# Patient Record
Sex: Male | Born: 1955 | Race: White | Hispanic: No | Marital: Married | Smoking: Never smoker
Health system: Southern US, Community
[De-identification: ages and names within clinical notes are randomized; demographics above are authoritative.]

## PROBLEM LIST (undated history)

## (undated) DIAGNOSIS — G8929 Other chronic pain: Secondary | ICD-10-CM

## (undated) DIAGNOSIS — E119 Type 2 diabetes mellitus without complications: Secondary | ICD-10-CM

## (undated) DIAGNOSIS — M25512 Pain in left shoulder: Secondary | ICD-10-CM

## (undated) DIAGNOSIS — M109 Gout, unspecified: Secondary | ICD-10-CM

## (undated) HISTORY — DX: Other chronic pain: G89.29

## (undated) HISTORY — DX: Type 2 diabetes mellitus without complications: E11.9

## (undated) HISTORY — DX: Pain in left shoulder: M25.512

## (undated) HISTORY — DX: Gout, unspecified: M10.9

---

## 2000-12-02 ENCOUNTER — Ambulatory Visit (HOSPITAL_COMMUNITY): Admission: RE | Admit: 2000-12-02 | Discharge: 2000-12-02 | Payer: Self-pay | Admitting: *Deleted

## 2014-03-01 DIAGNOSIS — E119 Type 2 diabetes mellitus without complications: Secondary | ICD-10-CM

## 2014-03-01 HISTORY — DX: Type 2 diabetes mellitus without complications: E11.9

## 2015-11-13 DIAGNOSIS — G8929 Other chronic pain: Secondary | ICD-10-CM

## 2015-11-13 DIAGNOSIS — M25512 Pain in left shoulder: Secondary | ICD-10-CM

## 2015-11-13 HISTORY — DX: Other chronic pain: G89.29

## 2017-03-07 DIAGNOSIS — M109 Gout, unspecified: Secondary | ICD-10-CM

## 2017-03-07 HISTORY — DX: Gout, unspecified: M10.9

## 2017-10-31 ENCOUNTER — Encounter: Payer: Self-pay | Admitting: Urology

## 2017-10-31 ENCOUNTER — Ambulatory Visit: Payer: Self-pay | Admitting: Urology

## 2017-10-31 VITALS — BP 147/94 | HR 75 | Ht 69.0 in | Wt 225.0 lb

## 2017-10-31 DIAGNOSIS — R350 Frequency of micturition: Secondary | ICD-10-CM

## 2017-10-31 LAB — URINALYSIS, COMPLETE
Bilirubin, UA: NEGATIVE
GLUCOSE, UA: NEGATIVE
KETONES UA: NEGATIVE
Leukocytes, UA: NEGATIVE
NITRITE UA: NEGATIVE
Protein, UA: NEGATIVE
RBC, UA: NEGATIVE
UUROB: 0.2 mg/dL (ref 0.2–1.0)
pH, UA: 6 (ref 5.0–7.5)

## 2017-10-31 LAB — MICROSCOPIC EXAMINATION
Bacteria, UA: NONE SEEN
Epithelial Cells (non renal): NONE SEEN /hpf (ref 0–10)
RBC MICROSCOPIC, UA: NONE SEEN /HPF (ref 0–?)

## 2017-10-31 LAB — BLADDER SCAN AMB NON-IMAGING

## 2017-10-31 NOTE — Progress Notes (Signed)
10/31/2017 10:24 AM   Christian Figueroa [birth year] [id number]  Referring provider: Kandyce Rud, MD [phone] [address] [facility] [place], [place] [address]  Chief Complaint  Patient presents with  . Benign Prostatic Hypertrophy    New Patient    HPI: The patient was referred for urinary frequency and a poor flow for many months.  He voids every 1 hour but can hold it for 2 hours.  His flow is poor.  He hesitates.  Sometimes he strains.  He does not feel empty.  He gets up twice at night  The patient's symptoms did not adequately respond to Flomax or Cialis  He is continent.  He is a non-insulin dependent diabetic  He denies a history of hematuria urinary tract infections and he has not had previous GU surgery  Modifying factors: There are no other modifying factors  Associated signs and symptoms: There are no other associated signs and symptoms Aggravating and relieving factors: There are no other aggravating or relieving factors Severity: Moderate Duration: Persistent   PMH: Past Medical History:  Diagnosis Date  . Chronic left shoulder pain 11/13/2015  . 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 Type II or unspecified type diabetes mellitus without mention of complication, not stated as uncontrolled 03/01/2014    Surgical History:   Home Medications:  Allergies as of 10/31/2017   No Known Allergies     Medication List        Accurate as of 10/31/17 10:24 AM. Always use your most recent med list.          glipiZIDE 10 MG 24 hr tablet Commonly known as:  GLUCOTROL XL Take by mouth.   metFORMIN 500 MG 24 hr tablet Commonly known as:  GLUCOPHAGE-XR Take by mouth.       Allergies: No Known Allergies  Family History: No family history on file.  Social History:  reports that  has never smoked. he has never used smokeless tobacco. He reports that he does not drink alcohol or use drugs.  ROS: UROLOGY Frequent Urination?:  Yes Hard to postpone urination?: Yes Burning/pain with urination?: No Get up at night to urinate?: Yes Leakage of urine?: Yes Urine stream starts and stops?: Yes Trouble starting stream?: Yes Do you have to strain to urinate?: No Blood in urine?: No Urinary tract infection?: No Sexually transmitted disease?: No Injury to kidneys or bladder?: No Painful intercourse?: No Weak stream?: Yes Erection problems?: Yes Penile pain?: No  Gastrointestinal Nausea?: No Vomiting?: No Indigestion/heartburn?: No Diarrhea?: No Constipation?: No  Constitutional Fever: No Night sweats?: No Weight loss?: No Fatigue?: No  Skin Skin rash/lesions?: No Itching?: No  Eyes Blurred vision?: No Double vision?: No  Ears/Nose/Throat Sore throat?: No Sinus problems?: No  Hematologic/Lymphatic Swollen glands?: No Easy bruising?: No  Cardiovascular Leg swelling?: No Chest pain?: No  Respiratory Cough?: No Shortness of breath?: No  Endocrine Excessive thirst?: No  Musculoskeletal Back pain?: No Joint pain?: No  Neurological Headaches?: No Dizziness?: No  Psychologic Depression?: No Anxiety?: No  Physical Exam: BP (!) 147/94   Pulse 75   Ht 5\' 9"  (1.753 m)   Wt 225 lb (102.1 kg)   BMI 33.23 kg/m   Constitutional:  Alert and oriented, No acute distress. HEENT:  AT, moist mucus membranes.  Trachea midline, no masses. Cardiovascular: No clubbing, cyanosis, or edema. Respiratory: Normal respiratory effort, no increased work of breathing. GI: Abdomen is soft, nontender, nondistended, no abdominal masses  GU: Male genitalia normal with a high riding scrotum.  50 g benign prostate Skin: No rashes, bruises or suspicious lesions. Lymph: No cervical or inguinal adenopathy. Neurologic: Grossly intact, no focal deficits, moving all 4 extremities. Psychiatric: Normal mood and affect.  Laboratory Data: No results found for: WBC, HGB, HCT, MCV, PLT  No results found for:  CREATININE  No results found for: PSA  No results found for: TESTOSTERONE  No results found for: HGBA1C  Urinalysis No results found for: COLORURINE, APPEARANCEUR, LABSPEC, PHURINE, GLUCOSEU, HGBUR, BILIRUBINUR, KETONESUR, PROTEINUR, UROBILINOGEN, NITRITE, LEUKOCYTESUR  Pertinent Imaging: None  Assessment & Plan: The patient has significant flow symptoms.  He has hourly frequency and mild nocturia.  After double voiding his residual was 126 mL and it originally was 213 mL.  Pathophysiology discussed.  By history his PSA was normal by his primary care physician.  We talked about the role of cystoscopy.  His cystoscopy does not demonstrate an obvious stricture he agreed that I would order urodynamics.  We will proceed accordingly.  If his residual remains elevated a screening renal ultrasound would be prudent to consider  1. Urinary frequency 2.  Weak urinary flow   - Urinalysis, Complete - BLADDER SCAN AMB NON-IMAGING   Return for cysto.  Martina SinnerMACDIARMID,Adisa Litt A, MD  [facility] [address], [address] [place], [place] [address] [phone]

## 2017-11-14 ENCOUNTER — Other Ambulatory Visit: Payer: Self-pay

## 2017-11-28 ENCOUNTER — Encounter: Payer: Self-pay | Admitting: Urology

## 2017-11-28 ENCOUNTER — Other Ambulatory Visit: Payer: Self-pay

## 2017-11-28 ENCOUNTER — Telehealth: Payer: Self-pay | Admitting: Urology

## 2017-11-28 ENCOUNTER — Emergency Department
Admission: EM | Admit: 2017-11-28 | Discharge: 2017-11-28 | Disposition: A | Discharge status: Home / Self Care | Payer: Self-pay | Attending: Emergency Medicine | Admitting: Emergency Medicine

## 2017-11-28 ENCOUNTER — Encounter: Payer: Self-pay | Admitting: Emergency Medicine

## 2017-11-28 ENCOUNTER — Ambulatory Visit (INDEPENDENT_AMBULATORY_CARE_PROVIDER_SITE_OTHER): Payer: Self-pay | Admitting: Urology

## 2017-11-28 VITALS — BP 171/110 | HR 96 | Resp 16 | Ht 69.0 in | Wt 235.6 lb

## 2017-11-28 DIAGNOSIS — E119 Type 2 diabetes mellitus without complications: Secondary | ICD-10-CM | POA: Insufficient documentation

## 2017-11-28 DIAGNOSIS — R030 Elevated blood-pressure reading, without diagnosis of hypertension: Secondary | ICD-10-CM | POA: Insufficient documentation

## 2017-11-28 DIAGNOSIS — R350 Frequency of micturition: Secondary | ICD-10-CM

## 2017-11-28 DIAGNOSIS — Z7984 Long term (current) use of oral hypoglycemic drugs: Secondary | ICD-10-CM | POA: Insufficient documentation

## 2017-11-28 LAB — CBC WITH DIFFERENTIAL/PLATELET
Basophils Absolute: 0.1 10*3/uL (ref 0–0.1)
Basophils Relative: 1 %
EOS ABS: 0.3 10*3/uL (ref 0–0.7)
EOS PCT: 3 %
HCT: 42.9 % (ref 40.0–52.0)
HEMOGLOBIN: 14.8 g/dL (ref 13.0–18.0)
LYMPHS ABS: 2.4 10*3/uL (ref 1.0–3.6)
LYMPHS PCT: 27 %
MCH: 30.6 pg (ref 26.0–34.0)
MCHC: 34.4 g/dL (ref 32.0–36.0)
MCV: 89 fL (ref 80.0–100.0)
MONOS PCT: 8 %
Monocytes Absolute: 0.7 10*3/uL (ref 0.2–1.0)
Neutro Abs: 5.5 10*3/uL (ref 1.4–6.5)
Neutrophils Relative %: 61 %
PLATELETS: 275 10*3/uL (ref 150–440)
RBC: 4.82 MIL/uL (ref 4.40–5.90)
RDW: 12.9 % (ref 11.5–14.5)
WBC: 9.1 10*3/uL (ref 3.8–10.6)

## 2017-11-28 LAB — BASIC METABOLIC PANEL
Anion gap: 7 (ref 5–15)
BUN: 16 mg/dL (ref 6–20)
CHLORIDE: 105 mmol/L (ref 101–111)
CO2: 25 mmol/L (ref 22–32)
CREATININE: 1.09 mg/dL (ref 0.61–1.24)
Calcium: 8.9 mg/dL (ref 8.9–10.3)
GFR calc Af Amer: >60 mL/min (ref >60)
GFR calc non Af Amer: >60 mL/min (ref >60)
Glucose, Bld: 136 mg/dL — ABNORMAL HIGH (ref 65–99)
POTASSIUM: 3.6 mmol/L (ref 3.5–5.1)
SODIUM: 137 mmol/L (ref 135–145)

## 2017-11-28 LAB — URINALYSIS, COMPLETE
BILIRUBIN UA: NEGATIVE
Ketones, UA: NEGATIVE
LEUKOCYTES UA: NEGATIVE
Nitrite, UA: NEGATIVE
PROTEIN UA: NEGATIVE
RBC, UA: NEGATIVE
Specific Gravity, UA: 1.015 (ref 1.005–1.030)
Urobilinogen, Ur: 0.2 mg/dL (ref 0.2–1.0)
pH, UA: 5.5 (ref 5.0–7.5)

## 2017-11-28 LAB — TROPONIN I: Troponin I: <0.03 ng/mL (ref <0.03)

## 2017-11-28 MED ORDER — LIDOCAINE HCL 2 % EX GEL
1.0000 "application " | Freq: Once | CUTANEOUS | Status: AC
  Administered 2017-11-28: 1 via URETHRAL

## 2017-11-28 MED ORDER — CIPROFLOXACIN HCL 500 MG PO TABS
500.0000 mg | ORAL_TABLET | Freq: Once | ORAL | Status: AC
  Administered 2017-11-28: 500 mg via ORAL

## 2017-11-28 NOTE — ED Triage Notes (Signed)
FIRST NURSE NOTE-here for elevated bps today.  Ambulatory. Unlabored. NAD

## 2017-11-28 NOTE — Progress Notes (Signed)
11/28/2017 9:26 AM   Christian Figueroa 1956/01/01 [id number]  Referring provider: Kandyce Rud, MD [phone] [facility] [name], [name]  Chief Complaint  Patient presents with  . Cysto    HPI: The patient was referred for urinary frequency and Figueroa poor flow for many months.  He voids every 1 hour but can hold it for 2 hours.  His flow is poor.  He hesitates.  Sometimes he strains.  He does not feel empty.  He gets up twice at night  The patient's symptoms did not adequately respond to Flomax or Cialis  He is continent.  He is Figueroa non-insulin dependent diabetic  The patient has significant flow symptoms.  He has hourly frequency and mild nocturia.  After double voiding his residual was 126 mL and it originally was 213 mL.  Pathophysiology discussed.  By history his PSA was normal by his primary care physician.  We talked about the role of cystoscopy.  His cystoscopy does not demonstrate an obvious stricture he agreed that I would order urodynamics.  We will proceed accordingly.  If his residual remains elevated Figueroa screening renal ultrasound would be prudent to consider  Today Flow stable.  Frequency stable.  Clinically not infected After verbal and written consent patient underwent cystoscopy: The penile and bulbar urethra are normal.  There was no stricture.  He had bilobar enlargement of the prostate.  Prostatic urethra was approximately 2.5 cm in length.  Verumontanum was easily identified.  It appeared he had Figueroa high riding bladder neck.  The bladder mucosa was normal.  I did not see any carcinoma or cystitis.  I did has some difficulty seeing the floor of the bladder and trigone in spite of multiple attempts.  He had Figueroa little bit of oozing in the prostatic urethra from friable blood vessels.  He tolerated the procedure well   PMH: Past Medical History:  Diagnosis Date  . Chronic left shoulder pain 11/13/2015  . Gout of right  ankle 03/07/2017  . Type II or unspecified type diabetes mellitus without mention of complication, not stated as uncontrolled 03/01/2014    Surgical History: History reviewed. No pertinent surgical history.  Home Medications:  Allergies as of 11/28/2017   No Known Allergies     Medication List        Accurate as of 11/28/17  9:26 AM. Always use your most recent med list.          glipiZIDE 10 MG 24 hr tablet Commonly known as:  GLUCOTROL XL Take by mouth.   metFORMIN 500 MG 24 hr tablet Commonly known as:  GLUCOPHAGE-XR Take by mouth.       Allergies: No Known Allergies  Family History: History reviewed. No pertinent family history.  Social History:  reports that he has never smoked. He has never used smokeless tobacco. He reports that he does not drink alcohol or use drugs.  ROS:                                        Physical Exam: BP (!) 171/110   Pulse 96   Resp 16   Ht 5\' 9"  (1.753 m)   Wt 235 lb 9.6 oz (106.9 kg)   SpO2 98%   BMI 34.79 kg/m   Constitutional:  Alert and oriented, No acute distress.  Laboratory Data:  No results found for: WBC, HGB, HCT, MCV, PLT  No results found for: CREATININE  No results found for: PSA  No results found for: TESTOSTERONE  No results found for: HGBA1C  Urinalysis    Component Value Date/Time   APPEARANCEUR Clear 10/31/2017 0954   GLUCOSEU Negative 10/31/2017 0954   BILIRUBINUR Negative 10/31/2017 0954   PROTEINUR Negative 10/31/2017 0954   NITRITE Negative 10/31/2017 0954   LEUKOCYTESUR Negative 10/31/2017 0954    Pertinent Imaging: none  Assessment & Plan:  The patient does not have Figueroa stricture.  He does have significant flow symptoms.  The role of urodynamics was described and ordered.  He may need an outlet procedure to reach his treatment goal.  If so reinspection of the trigone would be prudent.  2 days of ciprofloxacin samples given.  He may be an excellent candidate in the  future for procedure such as uro-lift  1. Urinary frequency  - Urinalysis, Complete - ciprofloxacin (CIPRO) tablet 500 mg - lidocaine (XYLOCAINE) 2 % jelly 1 application   No follow-ups on file.  Christian Figueroa,Christian Nicks A, MD  [facility] [address], [address] [place], [place] [address] [phone]

## 2017-11-28 NOTE — Telephone Encounter (Signed)
Patient did not schedule UDS yet waiting to hear back on the price for self pay patient's.  Marcelino DusterMichelle

## 2017-11-28 NOTE — Discharge Instructions (Addendum)
Your exam, EKG, and labs are all normal today. Continue to monitor your blood pressures and follow-up with Dr. Larwance SachsBabaoff for further management. Return to the ED as needed.

## 2017-11-28 NOTE — ED Triage Notes (Signed)
Pt arrived to the ED accompanied by his wife for complaints of high blood pressure. Pt states that he has been experiencing "high blood pressure all day and wants to get checked up." Pt is AOx4 in no apparent distress with no other accompanied symptoms.

## 2017-11-28 NOTE — ED Provider Notes (Signed)
[facility] Emergency Department Provider Note ____________________________________________  Time seen: 2012  I have reviewed the triage vital signs and the nursing notes.  HISTORY  Chief Complaint  Hypertension  HPI Christian Figueroa is a 62 y.o. male presents to the ED accompanied by his wife, for evaluation of elevated blood pressure.  Patient had a scheduled routine cystoscopy earlier today, and prior to the procedure experienced an episode of nausea without vomiting and shaking.  This was after he had his breakfast meal, so his wife checked his blood sugars which were around 200.  Patient did not experience any syncope, chest pain, or shortness of breath.  She also measured his blood pressure which is elevated at around 160/100.  The patient proceeded to have the cystoscopy performed and in the clinic was found to have a blood pressure at 171/100.  Patient presents today for evaluation of elevated blood pressure.  He denies a diagnosis of hypertension and denies any known history of elevated or out of range blood pressures.  He takes metformin for his diabetes but denies any other medications at this time.  He has not taken any over-the-counter decongestants, antihistamines, or anti-inflammatories.  He presents for evaluation of blood pressure.  He denies any chest pain, shortness of breath, paresthesias, weakness, or headache at this time.  Past Medical History:  Diagnosis Date  . Chronic left shoulder pain 11/13/2015  . 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 Type II or unspecified type diabetes mellitus without mention of complication, not stated as uncontrolled 03/01/2014    Patient Active Problem List   Diagnosis Date Noted  . 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 Chronic left shoulder pain 11/13/2015  . Type II or unspecified type diabetes mellitus without mention of complication, not stated as uncontrolled 03/01/2014    History reviewed. No pertinent surgical  history.  Prior to Admission medications   Medication Sig Start Date End Date Taking? Authorizing Provider  glipiZIDE (GLUCOTROL XL) 10 MG 24 hr tablet Take by mouth. 10/17/17 10/17/18  [provider]  metFORMIN (GLUCOPHAGE-XR) 500 MG 24 hr tablet Take 500 mg by mouth 2 (two) times daily.  10/17/17 10/17/18  [provider]    Allergies Patient has no known allergies.  History reviewed. No pertinent family history.  Social History Social History   Tobacco Use  . Smoking status: Never Smoker  . Smokeless tobacco: Never Used  Substance Use Topics  . Alcohol use: No    Frequency: Never  . Drug use: No    Review of Systems  Constitutional: Negative for fever. Eyes: Negative for visual changes. ENT: Negative for sore throat. Cardiovascular: Negative for chest pain.  Elevated blood pressure as above. Respiratory: Negative for shortness of breath. Gastrointestinal: Negative for abdominal pain, vomiting and diarrhea. Genitourinary: Negative for dysuria. Musculoskeletal: Negative for back pain. Skin: Negative for rash. Neurological: Negative for headaches, focal weakness or numbness. ____________________________________________  PHYSICAL EXAM:  VITAL SIGNS: ED Triage Vitals  Enc Vitals Group     BP 11/28/17 1907 (!) 161/99     Pulse Rate 11/28/17 1907 96     Resp 11/28/17 1907 18     Temp 11/28/17 1907 98.6 F (37 C)     Temp Source 11/28/17 1907 Oral     SpO2 11/28/17 1907 96 %     Weight 11/28/17 1908 236 lb (107 kg)     Height 11/28/17 1908 5\' 9"  (1.753 m)     Head Circumference --  Peak Flow --      Pain Score 11/28/17 1908 0     Pain Loc --      Pain Edu? --      Excl. in GC? --     Constitutional: Alert and oriented. Well appearing and in no distress. Head: Normocephalic and atraumatic. Eyes: Conjunctivae are normal. PERRL. Normal extraocular movements Neck: Supple. No thyromegaly. Cardiovascular: Normal rate, regular rhythm. Normal  distal pulses. Respiratory: Normal respiratory effort. No wheezes/rales/rhonchi. Gastrointestinal: Soft and nontender. No distention,, rebound, guarding, or rigidity. Musculoskeletal: Nontender with normal range of motion in all extremities.  Neurologic:  Normal gait without ataxia. Normal speech and language. No gross focal neurologic deficits are appreciated. Skin:  Skin is warm, dry and intact. No rash noted. Psychiatric: Mood and affect are normal. Patient exhibits appropriate insight and judgment. ____________________________________________   LABS (pertinent positives/negatives)  Labs Reviewed  BASIC METABOLIC PANEL - Abnormal; Notable for the following components:      Result Value   Glucose, Bld 136 (*)    All other components within normal limits  CBC WITH DIFFERENTIAL/PLATELET  TROPONIN I  ____________________________________________  EKG  NSR  Normal axis No STEMI ____________________________________________  INITIAL IMPRESSION / ASSESSMENT AND PLAN / ED COURSE  Patient with a ED evaluation of elevated blood pressures.  Patient does not have a existing diagnosis of hypertension.  Review of his chart does reveal several elevated blood pressures well into the category of class II hypertension.  Patient is reassured by his exam, labs, and EKG at this time.  He reports no complaints of pain at the time of this disposition.  He is inclined to follow-up with his primary provider and have discussion about the possibility of him needing to be on a blood pressure medicine.  No hypertensive emergency exist on this presentation.  He is discharged with instructions to continue to monitor blood pressures and follow-up with the primary provider as discussed.  Return precautions have been reviewed. ____________________________________________  FINAL CLINICAL IMPRESSION(S) / ED DIAGNOSES  Final diagnoses:  Elevated BP without diagnosis of hypertension      Anatole Apollo, Charlesetta Ivory,  PA-C 11/28/17 2146    Nita Sickle, MD 11/29/17 [phone]

## 2017-12-03 ENCOUNTER — Other Ambulatory Visit: Payer: Self-pay

## 2017-12-03 DIAGNOSIS — Z5321 Procedure and treatment not carried out due to patient leaving prior to being seen by health care provider: Secondary | ICD-10-CM | POA: Insufficient documentation

## 2017-12-03 DIAGNOSIS — I10 Essential (primary) hypertension: Secondary | ICD-10-CM | POA: Insufficient documentation

## 2017-12-03 LAB — BASIC METABOLIC PANEL
Anion gap: 12 (ref 5–15)
BUN: 21 mg/dL — ABNORMAL HIGH (ref 6–20)
CALCIUM: 9.1 mg/dL (ref 8.9–10.3)
CO2: 23 mmol/L (ref 22–32)
CREATININE: 1.41 mg/dL — AB (ref 0.61–1.24)
Chloride: 101 mmol/L (ref 101–111)
GFR calc Af Amer: >60 mL/min (ref >60)
GFR, EST NON AFRICAN AMERICAN: 53 mL/min — AB (ref >60)
GLUCOSE: 271 mg/dL — AB (ref 65–99)
Potassium: 3.8 mmol/L (ref 3.5–5.1)
Sodium: 136 mmol/L (ref 135–145)

## 2017-12-03 LAB — CBC
HCT: 42.5 % (ref 40.0–52.0)
Hemoglobin: 14.6 g/dL (ref 13.0–18.0)
MCH: 30.6 pg (ref 26.0–34.0)
MCHC: 34.3 g/dL (ref 32.0–36.0)
MCV: 89.3 fL (ref 80.0–100.0)
PLATELETS: 289 10*3/uL (ref 150–440)
RBC: 4.75 MIL/uL (ref 4.40–5.90)
RDW: 12.9 % (ref 11.5–14.5)
WBC: 11.4 10*3/uL — ABNORMAL HIGH (ref 3.8–10.6)

## 2017-12-03 LAB — TROPONIN I

## 2017-12-03 NOTE — ED Triage Notes (Signed)
BP at home was 217/107. C/o lightheaded, HA, dry mouth. Denies hx of HTN except these last few weeks. Denies being started on any HTN medications. Alert, oriented, ambulatory. No distress noted. States seeing PCP this coming Monday.

## 2017-12-03 NOTE — ED Notes (Signed)
First nurse note: pt states he is here for htn. Pt states blood pressure 217/115 at home, pt complains of "lightheadedness", pt appears in no acute distress.

## 2017-12-04 ENCOUNTER — Emergency Department
Admission: EM | Admit: 2017-12-04 | Discharge: 2017-12-04 | Disposition: A | Discharge status: Home / Self Care | Payer: Self-pay | Attending: Emergency Medicine | Admitting: Emergency Medicine

## 2017-12-04 NOTE — ED Notes (Signed)
Pt ambulatory with steady gait to ask for repeat BP. Pt and spouse stating at this time that they were going to leave if pt's BP was improved. Pt leaving at this time in NAD.

## 2017-12-05 NOTE — Telephone Encounter (Signed)
Patient stated that he is not interested in doing UDS at [facility] due to the cost. He is thinking about having it done somewhere else and if he does he will send the results to us.   Marcelino DusterMichelle

## 2018-01-27 ENCOUNTER — Other Ambulatory Visit: Payer: Self-pay | Admitting: Orthopedic Surgery

## 2018-01-27 DIAGNOSIS — M67912 Unspecified disorder of synovium and tendon, left shoulder: Secondary | ICD-10-CM

## 2018-02-07 ENCOUNTER — Ambulatory Visit
Admission: RE | Admit: 2018-02-07 | Discharge: 2018-02-07 | Disposition: A | Discharge status: Home / Self Care | Payer: 59 | Source: Ambulatory Visit | Attending: Orthopedic Surgery | Admitting: Orthopedic Surgery

## 2018-02-07 DIAGNOSIS — M62532 Muscle wasting and atrophy, not elsewhere classified, left forearm: Secondary | ICD-10-CM | POA: Insufficient documentation

## 2018-02-07 DIAGNOSIS — M75122 Complete rotator cuff tear or rupture of left shoulder, not specified as traumatic: Secondary | ICD-10-CM | POA: Diagnosis not present

## 2018-02-07 DIAGNOSIS — M19012 Primary osteoarthritis, left shoulder: Secondary | ICD-10-CM | POA: Insufficient documentation

## 2018-02-07 DIAGNOSIS — M67912 Unspecified disorder of synovium and tendon, left shoulder: Secondary | ICD-10-CM | POA: Insufficient documentation

## 2018-02-16 ENCOUNTER — Other Ambulatory Visit: Payer: Self-pay | Admitting: Urology

## 2018-03-27 ENCOUNTER — Encounter: Payer: Self-pay | Admitting: Urology

## 2018-03-27 ENCOUNTER — Ambulatory Visit (INDEPENDENT_AMBULATORY_CARE_PROVIDER_SITE_OTHER): Payer: 59 | Admitting: Urology

## 2018-03-27 VITALS — BP 155/94 | HR 93 | Ht 69.0 in | Wt 240.3 lb

## 2018-03-27 DIAGNOSIS — R3912 Poor urinary stream: Secondary | ICD-10-CM

## 2018-03-27 NOTE — Progress Notes (Signed)
03/27/2018 11:30 AM   Christian Figueroa [birth year] [id number]  Referring provider: Kandyce Rud, MD [phone] [facility] [place], [place] [address]  Chief Complaint  Patient presents with  . Follow-up    UDS results    HPI: The patient was referred for urinary frequency and a poor flow for many months. He voids every 1 hour but can hold it for 2 hours. His flow is poor. He hesitates. Sometimes he strains. He does not feel empty. He gets up twice at night  The patient's symptoms did not adequately respond to Flomax or Cialis  He is continent. He is a non-insulin dependent diabetic  The patient has significant flow symptoms. He has hourly frequency and mild nocturia. After double voiding his residual was 126 mL and it originally was 213 mL. Pathophysiology discussed. By history his PSA was normal by his primary care physician. We talked about the role of cystoscopy. His cystoscopy does not demonstrate an obvious stricture he agreed that I would order urodynamics. We will proceed accordingly.If his residual remains elevated a screening renal ultrasound would be prudent to consider  After verbal and written consent patient underwent cystoscopy: The penile and bulbar urethra are normal.  There was no stricture.  He had bilobar enlargement of the prostate.  Prostatic urethra was approximately 2.5 cm in length.  Verumontanum was easily identified.  It appeared he had a high riding bladder neck.  The bladder mucosa was normal.  I did not see any carcinoma or cystitis.  I did has some difficulty seeing the floor of the bladder and trigone in spite of multiple attempts.  He had a little bit of oozing in the prostatic urethra from friable blood vessels.  He tolerated the procedure well  Today I felt in the past the patient may be a candidate for a UroLift procedure.   Slow flow stable and frequency stable On urodynamics he had not  voided for 1 hour and was catheterized for 300 mL.  Maximum bladder capacity was 440 mL.  The patient had low pressure bladder instability reaching pressures of 8 cm of water.  He felt the urge but did not leak.  He had no stress incontinence with a Valsalva pressure of 81 cm of water.  During voluntary voiding he voided 338 mL.  Maximum voiding pressure was 83 cm of water.  Maximum flow was 12 mils per second.  Residual was 75 to 100 mL.  EMG activity was normal during the voiding phase.  Bladder was mildly trabeculated.  He had mild elevation of the bladder base         PMH: Past Medical History:  Diagnosis Date  . Chronic left shoulder pain 11/13/2015  . 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 Type II or unspecified type diabetes mellitus without mention of complication, not stated as uncontrolled 03/01/2014    Surgical History: History reviewed. No pertinent surgical history.  Home Medications:  Allergies as of 03/27/2018   No Known Allergies     Medication List        Accurate as of 03/27/18 11:30 AM. Always use your most recent med list.          glipiZIDE 10 MG 24 hr tablet Commonly known as:  GLUCOTROL XL Take by mouth.   metFORMIN 500 MG 24 hr tablet Commonly known as:  GLUCOPHAGE-XR Take 500 mg by mouth 2 (two) times daily.   telmisartan 80 MG tablet Commonly  known as:  MICARDIS       Allergies: No Known Allergies  Family History: History reviewed. No pertinent family history.  Social History:  reports that he has never smoked. He has never used smokeless tobacco. He reports that he does not drink alcohol or use drugs.  ROS:                                        Physical Exam: BP (!) 155/94 (BP Location: Right Arm, Patient Position: Sitting, Cuff Size: Normal)   Pulse 93   Ht 5\' 9"  (1.753 m)   Wt 240 lb 4.8 oz (109 kg)   BMI 35.49 kg/m   Constitutional:  Alert and oriented, No acute distress.  Laboratory Data: Lab Results    Component Value Date   WBC 11.4 (H) 12/03/2017   HGB 14.6 12/03/2017   HCT 42.5 12/03/2017   MCV 89.3 12/03/2017   PLT 289 12/03/2017    Lab Results  Component Value Date   CREATININE 1.41 (H) 12/03/2017    No results found for: PSA  No results found for: TESTOSTERONE  No results found for: HGBA1C  Urinalysis    Component Value Date/Time   APPEARANCEUR Clear 11/28/2017 0849   GLUCOSEU 2+ (A) 11/28/2017 0849   BILIRUBINUR Negative 11/28/2017 0849   PROTEINUR Negative 11/28/2017 0849   NITRITE Negative 11/28/2017 0849   LEUKOCYTESUR Negative 11/28/2017 0849    Pertinent Imaging:   Assessment & Plan: The patient urodynamically as obstructed.  He tends towards an elevated residual urine volume though it is mildly elevated.  Levels of approximately 100 mL I did not order a renal ultrasound.  He understands he may or may not be a good candidate for a UroLift procedure but certainly a more invasive prostate procedure could also be considered especially based upon his urodynamic findings.  He understands conceptually an office-based procedure or one under anesthesia would help him.  I did not mention that he might need repeat cystoscopy or not.  Obviously looking again at his trigone initially poorly visualized may be prudent but he has never had blood in the urine.  Follow-up with Dr. Apolinar JunesBrandon for discussion  There are no diagnoses linked to this encounter.  No follow-ups on file.  Martina SinnerMACDIARMID,Lorielle Boehning A, MD  [facility] [address], [address] [place], [place] [address] [phone]

## 2018-05-09 ENCOUNTER — Ambulatory Visit (INDEPENDENT_AMBULATORY_CARE_PROVIDER_SITE_OTHER): Payer: 59 | Admitting: Urology

## 2018-05-09 ENCOUNTER — Encounter: Payer: Self-pay | Admitting: Urology

## 2018-05-09 VITALS — BP 132/82 | HR 99 | Ht 70.0 in | Wt 230.0 lb

## 2018-05-09 DIAGNOSIS — R3912 Poor urinary stream: Secondary | ICD-10-CM | POA: Diagnosis not present

## 2018-05-09 DIAGNOSIS — N401 Enlarged prostate with lower urinary tract symptoms: Secondary | ICD-10-CM | POA: Diagnosis not present

## 2018-05-09 DIAGNOSIS — N138 Other obstructive and reflux uropathy: Secondary | ICD-10-CM

## 2018-05-09 NOTE — Progress Notes (Signed)
05/09/2018 3:42 PM   Christian Figueroa [birth year] [id number]  Referring provider: Kandyce Rud, MD [phone] [address] [facility] - Family and Internal Medicine [place], [place] [address]  Chief Complaint  Patient presents with  . Benign Prostatic Hypertrophy    discuss surgery    HPI: 62 year old male who presents today to discuss possible outlet procedures.  He is been previously seen and evaluated by Dr. Perley Jain and undergone extensive work-up for weak stream and sensation of incomplete bladder emptying including cystoscopy and urodynamics.  In summary, cystoscopy revealed bilobar coaptation with a 2.5 cm prostatic length without a significant median lobe although there was intravesical state with high riding bladder neck but no demonstrable median lobe.  Urodynamics showed good bladder capacity, mildly elevated postvoid residuals of 75 cc to 100.  Normal EMG.  Low pressure bladder at rest.  Pressure flow study showed a maximum voiding pressure of 83 cm of water with a max flow rate of 12 mL's per second.  Findings consistent with urinary obstruction.  Bladder is mildly trabeculated.  He did have a mildly elevated base of the bladder.  He continues to have obstructive voiding symptoms primarily weak stream with sensation of complete incomplete emptying.  He is dissatisfied with his voiding.  He does have some urinary frequency but this is minimal.  No dysuria or gross hematuria.  No recurrent UTIs.  No history of bladder stones.  He has tried Flomax in the past.  Also tried daily 5 mg Cialis without much effect.  He did not note significant improvement with his urinary symptoms after these medications.  Most recent PSA 3.86 on 02/2018.  PMH: Past Medical History:  Diagnosis Date  . Chronic left shoulder pain 11/13/2015  . 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 Type II or unspecified type diabetes mellitus without mention of complication, not stated as uncontrolled 03/01/2014     Surgical History: No past surgical history on file.  Home Medications:  Allergies as of 05/09/2018   No Known Allergies     Medication List        Accurate as of 05/09/18  3:42 PM. Always use your most recent med list.          glipiZIDE 10 MG 24 hr tablet Commonly known as:  GLUCOTROL XL Take by mouth.   metFORMIN 500 MG 24 hr tablet Commonly known as:  GLUCOPHAGE-XR Take 500 mg by mouth 2 (two) times daily.   telmisartan 80 MG tablet Commonly known as:  MICARDIS       Allergies: No Known Allergies  Family History: No family history on file.  Social History:  reports that he has never smoked. He has never used smokeless tobacco. He reports that he does not drink alcohol or use drugs.  ROS: UROLOGY Frequent Urination?: Yes Hard to postpone urination?: No Burning/pain with urination?: No Get up at night to urinate?: Yes Leakage of urine?: No Urine stream starts and stops?: Yes Trouble starting stream?: No Do you have to strain to urinate?: No Blood in urine?: No Urinary tract infection?: No Sexually transmitted disease?: No Injury to kidneys or bladder?: No Painful intercourse?: No Weak stream?: No Erection problems?: Yes Penile pain?: No  Gastrointestinal Nausea?: No Vomiting?: No Indigestion/heartburn?: No Diarrhea?: No Constipation?: No  Constitutional Fever: No Night sweats?: No Weight loss?: No Fatigue?: No  Skin Skin rash/lesions?: No Itching?: No  Eyes Blurred vision?: No Double vision?: No  Ears/Nose/Throat Sore throat?: No Sinus problems?: No  Hematologic/Lymphatic Swollen glands?: No Easy bruising?: No  Cardiovascular Leg swelling?: No Chest pain?: No  Respiratory Cough?: No Shortness of breath?: No  Endocrine Excessive thirst?: No  Musculoskeletal Back pain?: No Joint pain?: No  Neurological Headaches?: No Dizziness?: No  Psychologic Depression?: No Anxiety?: No  Physical Exam: BP 132/82    Pulse 99   Ht 5\' 10"  (1.778 m)   Wt 230 lb (104.3 kg)   BMI 33.00 kg/m   Constitutional:  Alert and oriented, No acute distress.  Accompanied by wife today. HEENT: [place] AT, moist mucus membranes.  Trachea midline, no masses. Cardiovascular: No clubbing, cyanosis, or edema. Respiratory: Normal respiratory effort, no increased work of breathing. Skin: No rashes, bruises or suspicious lesions. Neurologic: Grossly intact, no focal deficits, moving all 4 extremities. Psychiatric: Normal mood and affect.  Laboratory Data: Lab Results  Component Value Date   WBC 11.4 (H) 12/03/2017   HGB 14.6 12/03/2017   HCT 42.5 12/03/2017   MCV 89.3 12/03/2017   PLT 289 12/03/2017    Lab Results  Component Value Date   CREATININE 1.41 (H) 12/03/2017    Urinalysis    Component Value Date/Time   APPEARANCEUR Clear 11/28/2017 0849   GLUCOSEU 2+ (A) 11/28/2017 0849   BILIRUBINUR Negative 11/28/2017 0849   PROTEINUR Negative 11/28/2017 0849   NITRITE Negative 11/28/2017 0849   LEUKOCYTESUR Negative 11/28/2017 0849    Lab Results  Component Value Date   LABMICR See below: 10/31/2017   WBCUA 0-5 10/31/2017   RBCUA None seen 10/31/2017   LABEPIT None seen 10/31/2017   BACTERIA None seen 10/31/2017    Pertinent Imaging: n/a  Assessment & Plan:    1. BPH with urinary obstruction Obstructive urinary symptoms with UDS proven outlet obstruction We discussed that he is an excellent candidate for surgery Gust surgical options for all the procedures today at length including TURP, holmium laser enucleation of the prostate, ablative therapy and your Linkous possibilities He is previously failed alpha blockers and PDE 5 inhibitor Not interested in starting another medication and would prefer surgical intervention Prefer to have prostate volume in order to recommend optimal outlet procedure, will return next week for prostate sizing and surgery to be booked based on these findings He is agreeable  this plan  2. Weak urine stream As above   Return for ADd on next Friday (27th) AM at 10:45 for prostate sizing (biopsy).  Vanna ScotlandAshley Brissia Delisa, MD  [facility] [address], [address] [place], [place] [address] [phone]

## 2018-05-19 ENCOUNTER — Ambulatory Visit (INDEPENDENT_AMBULATORY_CARE_PROVIDER_SITE_OTHER): Payer: 59 | Admitting: Urology

## 2018-05-19 ENCOUNTER — Other Ambulatory Visit: Payer: Self-pay | Admitting: Urology

## 2018-05-19 ENCOUNTER — Telehealth: Payer: Self-pay | Admitting: Urology

## 2018-05-19 ENCOUNTER — Encounter: Payer: Self-pay | Admitting: Urology

## 2018-05-19 VITALS — BP 154/92 | HR 90 | Resp 16 | Ht 70.0 in | Wt 238.1 lb

## 2018-05-19 DIAGNOSIS — N138 Other obstructive and reflux uropathy: Secondary | ICD-10-CM | POA: Diagnosis not present

## 2018-05-19 DIAGNOSIS — N401 Enlarged prostate with lower urinary tract symptoms: Secondary | ICD-10-CM

## 2018-05-19 NOTE — Progress Notes (Signed)
05/19/18  Chief Complaint  Patient presents with  . TRUS lift     HPI: 62 year old male with refractory urinary symptoms related to BPH who presents today to the office for prostate sizing.  Please see previous note for details.  He has urodynamic proven bladder outlet obstruction and is considering surgical intervention for obstructive voiding symptoms.   Please see previous notes for details.     Blood pressure (!) 154/92, pulse 90, resp. rate 16, height 5\' 10"  (1.778 m), weight 238 lb 1.6 oz (108 kg). NED. A&Ox3.   No respiratory distress   Abd soft, NT, ND Normal sphincter tone    Prostate transrectal ultrasound sizing   Informed consent was obtained after discussing risks/benefits of the procedure.  A time out was performed to ensure correct patient identity.   Pre-Procedure: -Transrectal probe was placed without difficulty -Transrectal Ultrasound performed revealing a 78.85 gm prostate measuring 5.13 x 5.45 x 5.39 cm (length) -No significant hypoechoic lesions appreciated -Diffuse nonspecific prostatic calcifications, including the apex  -Intravesical  Protrusion prostate into the bladder appreciated   Assessment/ Plan:  1. BPH with urinary obstruction Prostamegaly with intravesical protrusion of the prostate Prostate volume 78 cc As previously discussed, the patient is excellent candidate for outlet procedure and would prefer this over pharmacotherapy Options include TURP versus holmium laser enucleation of the prostate versus UroLift which is less favorable given intravesical component of his prostate He is most interested in holmium laser enucleation of the prostate We discussed alternatives including TURP vs. holmium laser enucleation of the prostate vs. greenlight laser ablation. Differences between the surgical procedures were discussed as well as the risks and benefits of each. He is most interested in HoLEP.  We reviewed the surgery in detail today including  the preoperative, intraoperative, and postoperative course.  This will most likely be an outpatient procedure pending the degree of post op hematuria.  He will go home with catheter for a few days post op and will either be taught how to remove his own catheter or return to the office for catheter removal.  Risk of bleeding, infection, damage surrounding structures, injury to the bladder/ urethral, bladder neck contracture, ureteral stricture, retrograde ejaculation, stress/ urge incontinence, exacerbation of irritative voiding symptoms were all discussed in detail.     Schedule HoLEP  Vanna Scotland, MD

## 2018-05-19 NOTE — Telephone Encounter (Signed)
Pt called and would like a picture and measurements from biopsy (this a.m.) sent to his MyChart account.  Please call pt [phone]

## 2018-05-22 NOTE — Telephone Encounter (Signed)
Please ask what he questions are or ask him to send mychart message if possible.  Clinic notes also availible via mychart.    Vanna Scotland, MD

## 2018-05-22 NOTE — Telephone Encounter (Signed)
Spoke to patient and he and his wife have a couple more questions before scheduling the surgery. He would like to talk to you via phone if possible. I tried to schedule something but you are booked. Please advise.

## 2018-05-23 NOTE — Telephone Encounter (Signed)
Spoke to patient. He is shaving a hard time getting the notes to come up in Mychart. He will come by and pick up a copy of the note.

## 2018-05-29 NOTE — Telephone Encounter (Signed)
Called patient to discuss scheduling HOLEP but patient requests appointment with Dr Apolinar Junes to discuss other treatment options prior to scheduling surgery. Appointment made for 05/31/2018.

## 2018-05-31 ENCOUNTER — Other Ambulatory Visit: Payer: Self-pay

## 2018-05-31 ENCOUNTER — Ambulatory Visit (INDEPENDENT_AMBULATORY_CARE_PROVIDER_SITE_OTHER): Payer: 59 | Admitting: Urology

## 2018-05-31 ENCOUNTER — Encounter: Payer: Self-pay | Admitting: Urology

## 2018-05-31 VITALS — BP 154/97 | HR 93 | Ht 69.0 in | Wt 241.0 lb

## 2018-05-31 DIAGNOSIS — N5203 Combined arterial insufficiency and corporo-venous occlusive erectile dysfunction: Secondary | ICD-10-CM

## 2018-05-31 DIAGNOSIS — N138 Other obstructive and reflux uropathy: Secondary | ICD-10-CM

## 2018-05-31 DIAGNOSIS — N401 Enlarged prostate with lower urinary tract symptoms: Secondary | ICD-10-CM | POA: Diagnosis not present

## 2018-05-31 MED ORDER — SILDENAFIL CITRATE 20 MG PO TABS: 20.0000 mg | ORAL_TABLET | ORAL | 11 refills | Status: AC | PRN

## 2018-05-31 NOTE — Progress Notes (Signed)
05/31/2018 9:37 AM   Christian Figueroa 20-Apr-1956 [id number]  Referring provider: Kandyce Rud, MD [phone] [address] [facility] - Family and Internal Medicine [place], [place] [address]  Chief Complaint  Patient presents with  . Follow-up    HPI: 62 year old male with a history of BPH and obstructive urinary symptoms who returns today at the patient's request to ask additional questions about holmium laser enucleation of the prostate as well as other alternative interventions.  Please see previous notes for details.  He is accompanied today by his wife.  He does also mention today that he has a personal history of erectile dysfunction.  He previously used Cialis daily 5 mg for his urinary symptoms as well as erectile dysfunction which helped somewhat.  He is interested in trying Viagra.  He is seeing a cardiologist within the next month for cardiac work-up including stress test and echo.   PMH: Past Medical History:  Diagnosis Date  . Chronic left shoulder pain 11/13/2015  . 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 Type II or unspecified type diabetes mellitus without mention of complication, not stated as uncontrolled 03/01/2014    Surgical History: History reviewed. No pertinent surgical history.  Home Medications:  Allergies as of 05/31/2018   No Known Allergies     Medication List        Accurate as of 05/31/18  9:37 AM. Always use your most recent med list.          glipiZIDE 10 MG 24 hr tablet Commonly known as:  GLUCOTROL XL Take by mouth.   metFORMIN 500 MG 24 hr tablet Commonly known as:  GLUCOPHAGE-XR Take 500 mg by mouth 2 (two) times daily.   telmisartan 80 MG tablet Commonly known as:  MICARDIS       Allergies: No Known Allergies  Family History: History reviewed. No pertinent family history.  Social History:  reports that he has never smoked. He has never used smokeless tobacco. He reports that he does not drink alcohol or use  drugs.  ROS: UROLOGY Frequent Urination?: Yes Hard to postpone urination?: Yes Burning/pain with urination?: No Get up at night to urinate?: Yes Leakage of urine?: Yes Urine stream starts and stops?: Yes Trouble starting stream?: Yes Do you have to strain to urinate?: Yes Blood in urine?: No Urinary tract infection?: No Sexually transmitted disease?: No Injury to kidneys or bladder?: No Painful intercourse?: No Weak stream?: Yes Erection problems?: Yes Penile pain?: No  Gastrointestinal Nausea?: No Vomiting?: No Indigestion/heartburn?: No Diarrhea?: No Constipation?: No  Constitutional Fever: No Night sweats?: No Weight loss?: No Fatigue?: No  Skin Skin rash/lesions?: No Itching?: No  Eyes Blurred vision?: No Double vision?: No  Ears/Nose/Throat Sore throat?: No Sinus problems?: No  Hematologic/Lymphatic Swollen glands?: No Easy bruising?: No  Cardiovascular Leg swelling?: No Chest pain?: No  Respiratory Cough?: No Shortness of breath?: No  Endocrine Excessive thirst?: No  Musculoskeletal Back pain?: No Joint pain?: No  Neurological Headaches?: No Dizziness?: No  Psychologic Depression?: No Anxiety?: No  Physical Exam: BP (!) 154/97   Pulse 93   Ht 5\' 9"  (1.753 m)   Wt 241 lb (109.3 kg)   BMI 35.59 kg/m   Constitutional:  Alert and oriented, No acute distress.  Accompanied by wife today. HEENT: [place] AT, moist mucus membranes.  Trachea midline, no masses. Cardiovascular: No clubbing, cyanosis, or edema. Respiratory: Normal respiratory effort, no increased work of breathing. Skin: No rashes, bruises or suspicious lesions. Neurologic:  Grossly intact, no focal deficits, moving all 4 extremities. Psychiatric: Normal mood and affect.  Laboratory Data: Lab Results  Component Value Date   WBC 11.4 (H) 12/03/2017   HGB 14.6 12/03/2017   HCT 42.5 12/03/2017   MCV 89.3 12/03/2017   PLT 289 12/03/2017    Lab Results  Component Value  Date   CREATININE 1.41 (H) 12/03/2017    Urinalysis N/a  Pertinent Imaging: N/a  Assessment & Plan:    1. 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

## 2018-06-01 ENCOUNTER — Other Ambulatory Visit: Payer: Self-pay | Admitting: Radiology

## 2018-06-01 ENCOUNTER — Telehealth: Payer: Self-pay | Admitting: Radiology

## 2018-06-01 DIAGNOSIS — N138 Other obstructive and reflux uropathy: Secondary | ICD-10-CM

## 2018-06-01 DIAGNOSIS — N401 Enlarged prostate with lower urinary tract symptoms: Principal | ICD-10-CM

## 2018-06-01 NOTE — Telephone Encounter (Signed)
LMOM for patient. Need to discuss upcoming surgery with Dr Apolinar Junes.  Spoke with fiance, Gigi Gin, who states patient has stress test & echo scheduled with Dr Juliann Pares on 06/21/2018. She will call back to schedule after checking her calendar.

## 2018-06-02 NOTE — Telephone Encounter (Signed)
Yes, she can remove is foley at home.  Vanna Scotland, MD

## 2018-06-02 NOTE — Telephone Encounter (Signed)
Spoke with fiance, Peggy, regarding HOLEP scheduled 06/28/2018 with Dr Apolinar Junes. Follow up appointment with Dr Apolinar Junes was made.  Dr Apolinar Junes, Fiance states they discussed Foley removal at home with you & that voiding trial in the office is not necessary. Please advise.

## 2018-06-07 ENCOUNTER — Telehealth: Payer: Self-pay | Admitting: Radiology

## 2018-06-07 NOTE — Telephone Encounter (Signed)
Patient called to cancel HOLEP scheduled with Dr Apolinar Junes on 06/28/2018. He would not like to make a follow up appointment at this time.

## 2018-06-21 ENCOUNTER — Other Ambulatory Visit: Payer: Self-pay

## 2018-06-22 ENCOUNTER — Other Ambulatory Visit: Payer: Self-pay

## 2018-06-28 ENCOUNTER — Ambulatory Visit: Admit: 2018-06-28 | Payer: 59 | Admitting: Urology

## 2018-06-28 SURGERY — ENUCLEATION, PROSTATE, USING LASER, WITH MORCELLATION
Anesthesia: Choice

## 2018-06-29 ENCOUNTER — Emergency Department
Admission: EM | Admit: 2018-06-29 | Discharge: 2018-06-29 | Disposition: A | Discharge status: Home / Self Care | Payer: 59 | Attending: Emergency Medicine | Admitting: Emergency Medicine

## 2018-06-29 ENCOUNTER — Encounter: Payer: Self-pay | Admitting: Emergency Medicine

## 2018-06-29 ENCOUNTER — Other Ambulatory Visit: Payer: Self-pay

## 2018-06-29 DIAGNOSIS — Z7984 Long term (current) use of oral hypoglycemic drugs: Secondary | ICD-10-CM | POA: Insufficient documentation

## 2018-06-29 DIAGNOSIS — R339 Retention of urine, unspecified: Secondary | ICD-10-CM | POA: Insufficient documentation

## 2018-06-29 DIAGNOSIS — E119 Type 2 diabetes mellitus without complications: Secondary | ICD-10-CM | POA: Diagnosis not present

## 2018-06-29 NOTE — ED Provider Notes (Signed)
[facility] Emergency Department Provider Note   ____________________________________________    I have reviewed the triage vital signs and the nursing notes.   HISTORY  Chief Complaint Urinary Retention     HPI Christian Figueroa is a 62 y.o. male who presents with complaints of urinary retention.  Patient reports 1 week ago he had a prostate reduction procedure in [place].  He removed the catheter at home as instructed yesterday, had very little urinary output yesterday.  Almost none today.  He states that he feels like he is going to burst, he describes severe discomfort in his suprapubic area with swelling.  He is unable to urinate.  No fevers.   Past Medical History:  Diagnosis Date  . Chronic left shoulder pain 11/13/2015  . 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 Type II or unspecified type diabetes mellitus without mention of complication, not stated as uncontrolled 03/01/2014    Patient Active Problem List   Diagnosis Date Noted  . 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 Chronic left shoulder pain 11/13/2015  . Type II or unspecified type diabetes mellitus without mention of complication, not stated as uncontrolled 03/01/2014    History reviewed. No pertinent surgical history.  Prior to Admission medications   Medication Sig Start Date End Date Taking? Authorizing Provider  glipiZIDE (GLUCOTROL XL) 10 MG 24 hr tablet Take by mouth. 10/17/17 10/17/18  [provider]  metFORMIN (GLUCOPHAGE-XR) 500 MG 24 hr tablet Take 500 mg by mouth 2 (two) times daily.  10/17/17 10/17/18  [provider]  sildenafil (REVATIO) 20 MG tablet Take 1 tablet (20 mg total) by mouth as needed. Take 1-5 tabs as needed prior to intercourse 05/31/18   Vanna Scotland, MD  telmisartan (MICARDIS) 80 MG tablet  03/10/18   [provider]     Allergies Patient has no known allergies.  No family history on file.  Social History Social History   Tobacco Use    . Smoking status: Never Smoker  . Smokeless tobacco: Never Used  Substance Use Topics  . Alcohol use: No    Frequency: Never  . Drug use: No    Review of Systems  Constitutional: No fever/chills   Gastrointestinal: No abdominal pain.  Genitourinary: As above Musculoskeletal: Negative for back pain. Skin: Negative for rash. Neurological: Negative for weakness, numbness   ____________________________________________   PHYSICAL EXAM:  VITAL SIGNS: ED Triage Vitals  Enc Vitals Group     BP --      Pulse --      Resp --      Temp --      Temp src --      SpO2 --      Weight 06/29/18 1632 106.6 kg (235 lb)     Height 06/29/18 1632 1.753 m (5\' 9" )     Head Circumference --      Peak Flow --      Pain Score 06/29/18 1631 10     Pain Loc --      Pain Edu? --      Excl. in GC? --     Constitutional: Alert and oriented.  Very uncomfortable Eyes: Conjunctivae are normal.   Nose: No congestion/rhinnorhea. Mouth/Throat: Mucous membranes are moist.    Cardiovascular: Normal rate, regular rhythm. Grossly normal heart sounds.  Good peripheral circulation. Respiratory: Normal respiratory effort.  No retractions.  Gastrointestinal: Soft and nontender.  Suprapubic distention with tenderness Genitourinary: deferred Musculoskeletal: No lower  extremity tenderness nor edema.   Neurologic:  Normal speech and language. No gross focal neurologic deficits are appreciated.  Skin:  Skin is warm, dry and intact. No rash noted. Psychiatric: Mood and affect are normal. Speech and behavior are normal.  ____________________________________________   LABS (all labs ordered are listed, but only abnormal results are displayed)  Labs Reviewed  URINE CULTURE   ____________________________________________  EKG  None ____________________________________________  RADIOLOGY  None ____________________________________________   PROCEDURES  Procedure(s) performed:  No  Procedures   Critical Care performed: No ____________________________________________   INITIAL IMPRESSION / ASSESSMENT AND PLAN / ED COURSE  Pertinent labs & imaging results that were available during my care of the patient were reviewed by me and considered in my medical decision making (see chart for details).  Patient presents with urinary retention, he is quite uncomfortable.  Bladder scan demonstrates greater than 800 cc.  Foley insertion provided significant relief with 800 cc of urine, yellow, clear out  Will send urine culture, discharge with Foley leg bag, outpatient follow-up with his urologist    ____________________________________________   FINAL CLINICAL IMPRESSION(S) / ED DIAGNOSES  Final diagnoses:  Urinary retention        Note:  This document was prepared using Dragon voice recognition software and may include unintentional dictation errors.    Jene Every, MD 06/29/18 1650

## 2018-06-29 NOTE — ED Notes (Signed)
Patient reports 2/10 pain at present time. 1000 clear yellow urine output noted after foley insertion. Vss. Md at bedside to re-assess. Patient discharged to home with follow up instructions. Leg bag applied.

## 2018-06-29 NOTE — ED Triage Notes (Addendum)
Pt has a Rezume procedure performed 10/31 to reduce prostate size and had a catheter placed after. PT had urinary catheter removed yesterday at 1200 and states he had had minimal ability to urinate since. PT states he feels immense pressure in his lower abdomen/bladder. found on bladder scan

## 2018-07-01 LAB — URINE CULTURE: Culture: NO GROWTH

## 2018-07-06 ENCOUNTER — Ambulatory Visit: Payer: Self-pay | Admitting: Internal Medicine

## 2018-08-09 ENCOUNTER — Ambulatory Visit: Payer: Self-pay | Admitting: Urology

## 2018-08-10 IMAGING — MR MR SHOULDER*L* W/O CM
5 series · 40 of 40 positions shown · non-contrast
Comparison: None.

CLINICAL DATA: Chronic left shoulder pain and decreased range of
motion.

EXAM:
MRI OF THE LEFT SHOULDER WITHOUT CONTRAST
TECHNIQUE: Multiplanar, multisequence MR imaging of the shoulder was performed.
No intravenous contrast was administered.

[Series 3: T2 fat-sat · axial · 4.0mm · 0.59mm/px · z∈[-33,+68]mm · 10 of 24 slices shown (1 of 3)]
[im 1/24]
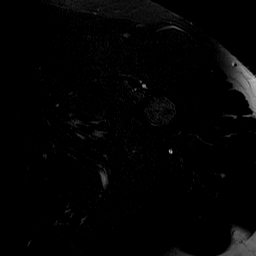
[im 3/24]
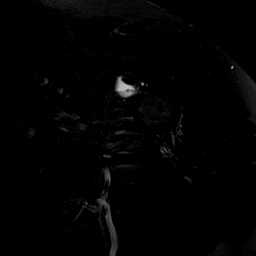
[im 6/24]
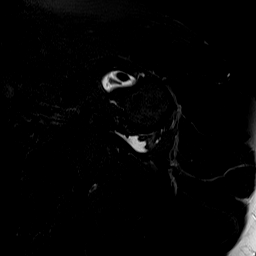
[im 8/24]
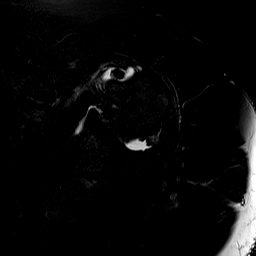
[im 11/24]
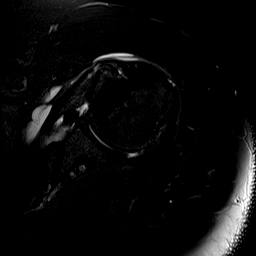
[im 13/24]
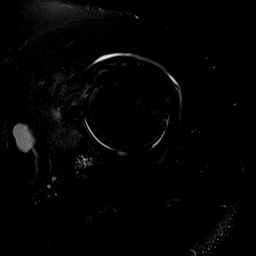
[im 16/24]
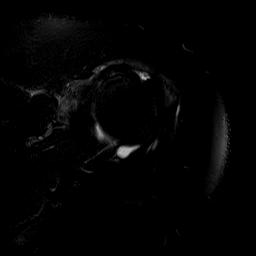
[im 18/24]
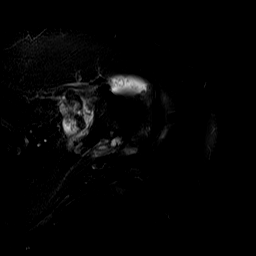
[im 21/24]
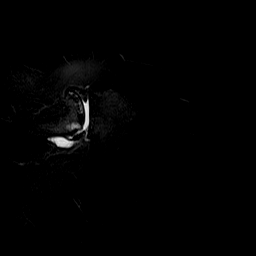
[im 24/24]
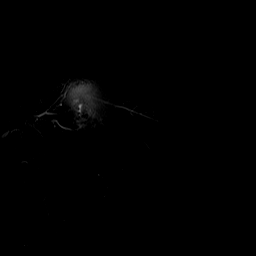

[Series 4: T2 fat-sat · oblique · 4.0mm · 0.59mm/px · 7 of 20 slices shown (2 of 3)]
[im 1/20]
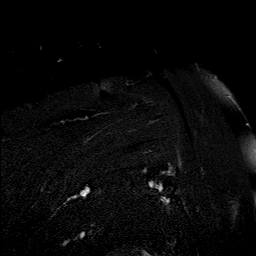
[im 4/20]
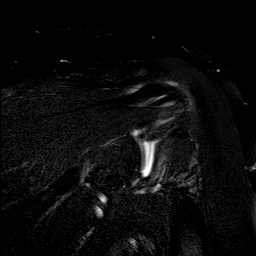
[im 7/20]
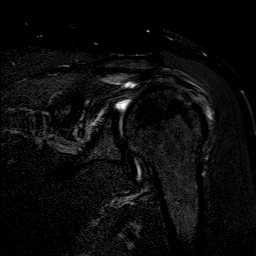
[im 10/20]
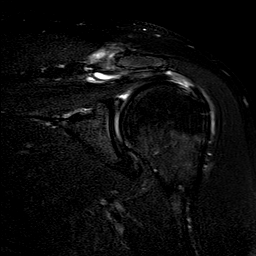
[im 13/20]
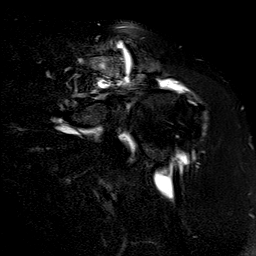
[im 16/20]
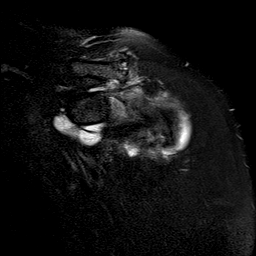
[im 20/20]
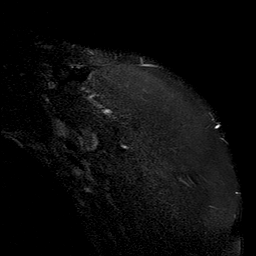

[Series 5: PD · oblique · 4.0mm · 0.59mm/px · 7 of 20 slices shown]
[im 1/20]
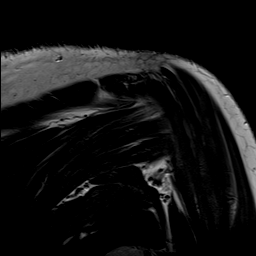
[im 4/20]
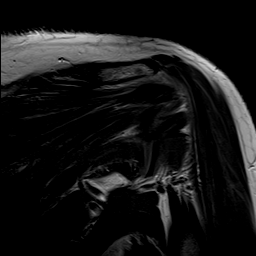
[im 7/20]
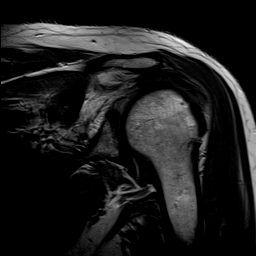
[im 10/20]
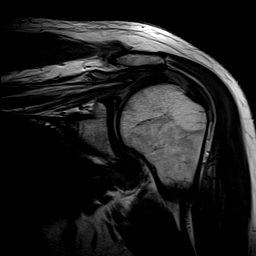
[im 13/20]
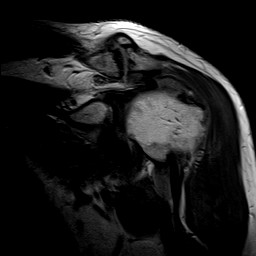
[im 16/20]
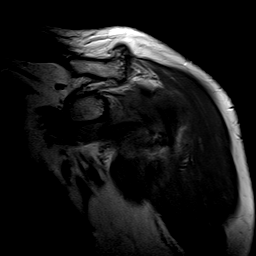
[im 20/20]
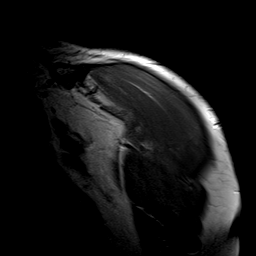

[Series 6: T1 · oblique · 4.0mm · 0.59mm/px · 8 of 23 slices shown]
[im 1/23]
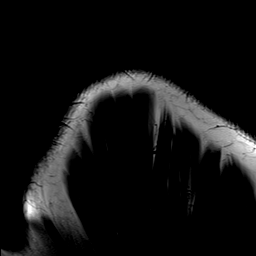
[im 4/23]
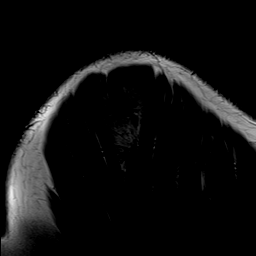
[im 7/23]
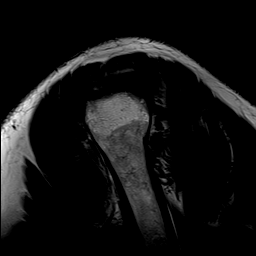
[im 10/23]
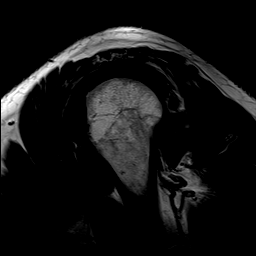
[im 13/23]
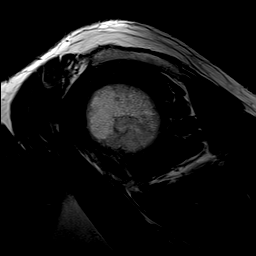
[im 16/23]
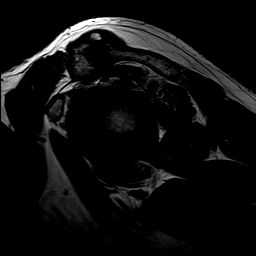
[im 19/23]
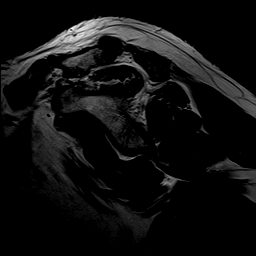
[im 23/23]
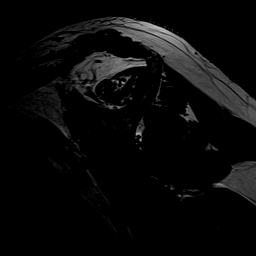

[Series 7: T2 fat-sat · oblique · 4.0mm · 0.59mm/px · 8 of 23 slices shown (3 of 3)]
[im 1/23]
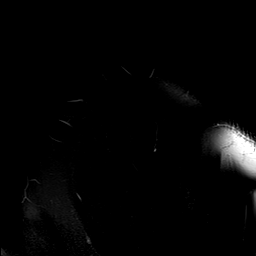
[im 4/23]
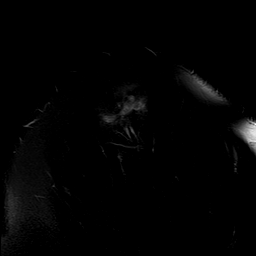
[im 7/23]
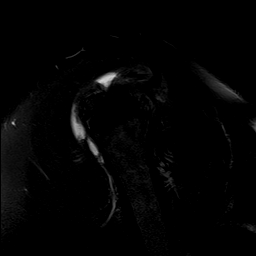
[im 10/23]
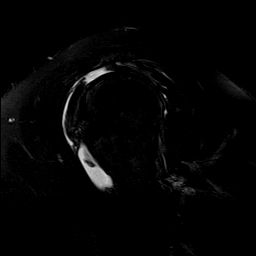
[im 13/23]
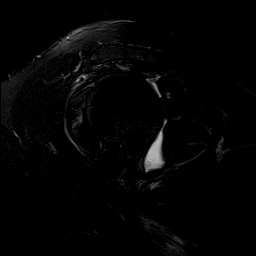
[im 16/23]
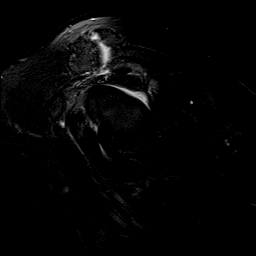
[im 19/23]
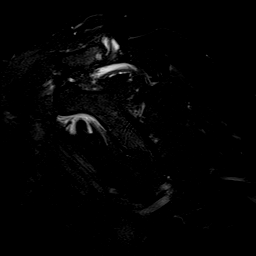
[im 23/23]
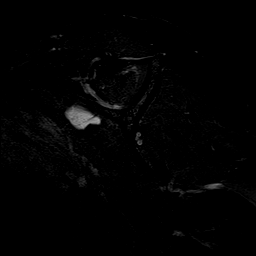

[40 of 40 positions shown; findings below may reference images not displayed]

FINDINGS: Rotator cuff: There is rotator cuff tendinopathy. The patient has a
full-thickness tear of the mid and posterior supraspinatus extending
into the anterior infraspinatus. The tear measures 2 cm from front
to back with retraction of 2-3 cm. Interstitial tearing of the
subscapularis is identified.

Muscles: Mild supraspinatus atrophy is seen unremarkable.. Otherwise

Biceps long head: Intrasubstance increased T2 signal is seen in the
intra-articular segment of the tendon and there is a large volume of
fluid about the tendon in the bicipital groove.

Acromioclavicular Joint: Bulky acromioclavicular osteoarthritis is
present. Type 2 acromion. There is fluid in the
subacromial/subdeltoid bursa.

Glenohumeral Joint: Mild degenerative change is seen.

Labrum: The superior labrum is markedly degenerated without discrete
tear.

Bones:  No fracture or worrisome lesion.

Other: None.
IMPRESSION: Rotator cuff tendinopathy with a 2 cm from front to back
full-thickness tear of the mid to posterior supraspinatus which
extends into the anterior infraspinatus. Retraction is 2-3 cm. There
is mild supraspinatus atrophy. Interstitial tearing of the
subscapularis is also seen

Long head of biceps tendinopathy without tear.

Bulky acromioclavicular osteoarthritis.

Subacromial/subdeltoid fluid consistent with bursitis.
